# Patient Record
Sex: Female | Born: 1995 | Race: Black or African American | Hispanic: No | Marital: Single | Smoking: Never smoker
Health system: Southern US, Community
[De-identification: ages and names within clinical notes are randomized; demographics above are authoritative.]

---
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
## 2015-11-30 DIAGNOSIS — Z3200 Encounter for pregnancy test, result unknown: Secondary | ICD-10-CM | POA: Diagnosis not present

## 2015-11-30 DIAGNOSIS — N921 Excessive and frequent menstruation with irregular cycle: Secondary | ICD-10-CM | POA: Diagnosis not present

## 2015-11-30 DIAGNOSIS — Z7251 High risk heterosexual behavior: Secondary | ICD-10-CM | POA: Diagnosis not present

## 2015-11-30 DIAGNOSIS — Z113 Encounter for screening for infections with a predominantly sexual mode of transmission: Secondary | ICD-10-CM | POA: Diagnosis not present

## 2015-12-06 DIAGNOSIS — A5400 Gonococcal infection of lower genitourinary tract, unspecified: Secondary | ICD-10-CM | POA: Diagnosis not present

## 2015-12-12 DIAGNOSIS — R938 Abnormal findings on diagnostic imaging of other specified body structures: Secondary | ICD-10-CM | POA: Diagnosis not present

## 2015-12-12 DIAGNOSIS — N921 Excessive and frequent menstruation with irregular cycle: Secondary | ICD-10-CM | POA: Diagnosis not present

## 2015-12-12 DIAGNOSIS — R300 Dysuria: Secondary | ICD-10-CM | POA: Diagnosis not present

## 2015-12-22 DIAGNOSIS — Z1159 Encounter for screening for other viral diseases: Secondary | ICD-10-CM | POA: Diagnosis not present

## 2015-12-22 DIAGNOSIS — R350 Frequency of micturition: Secondary | ICD-10-CM | POA: Diagnosis not present

## 2015-12-22 DIAGNOSIS — R102 Pelvic and perineal pain: Secondary | ICD-10-CM | POA: Diagnosis not present

## 2015-12-22 DIAGNOSIS — Z114 Encounter for screening for human immunodeficiency virus [HIV]: Secondary | ICD-10-CM | POA: Diagnosis not present

## 2015-12-22 DIAGNOSIS — Z113 Encounter for screening for infections with a predominantly sexual mode of transmission: Secondary | ICD-10-CM | POA: Diagnosis not present

## 2015-12-22 DIAGNOSIS — R109 Unspecified abdominal pain: Secondary | ICD-10-CM | POA: Diagnosis not present

## 2016-01-23 DIAGNOSIS — Z681 Body mass index (BMI) 19 or less, adult: Secondary | ICD-10-CM | POA: Diagnosis not present

## 2016-01-23 DIAGNOSIS — Z113 Encounter for screening for infections with a predominantly sexual mode of transmission: Secondary | ICD-10-CM | POA: Diagnosis not present

## 2016-01-23 DIAGNOSIS — Z01419 Encounter for gynecological examination (general) (routine) without abnormal findings: Secondary | ICD-10-CM | POA: Diagnosis not present

## 2016-02-07 DIAGNOSIS — H5213 Myopia, bilateral: Secondary | ICD-10-CM | POA: Diagnosis not present

## 2016-03-30 DIAGNOSIS — S30860A Insect bite (nonvenomous) of lower back and pelvis, initial encounter: Secondary | ICD-10-CM | POA: Diagnosis not present

## 2016-05-26 ENCOUNTER — Emergency Department (HOSPITAL_COMMUNITY)
Admission: EM | Admit: 2016-05-26 | Discharge: 2016-05-26 | Disposition: A | Discharge status: Home / Self Care | Payer: BLUE CROSS/BLUE SHIELD | Attending: Emergency Medicine | Admitting: Emergency Medicine

## 2016-05-26 ENCOUNTER — Emergency Department (HOSPITAL_COMMUNITY): Payer: BLUE CROSS/BLUE SHIELD

## 2016-05-26 ENCOUNTER — Encounter (HOSPITAL_COMMUNITY): Payer: Self-pay

## 2016-05-26 DIAGNOSIS — T7411XA Adult physical abuse, confirmed, initial encounter: Secondary | ICD-10-CM | POA: Diagnosis not present

## 2016-05-26 DIAGNOSIS — Y929 Unspecified place or not applicable: Secondary | ICD-10-CM | POA: Insufficient documentation

## 2016-05-26 DIAGNOSIS — S0993XA Unspecified injury of face, initial encounter: Secondary | ICD-10-CM | POA: Diagnosis present

## 2016-05-26 DIAGNOSIS — S01112A Laceration without foreign body of left eyelid and periocular area, initial encounter: Secondary | ICD-10-CM | POA: Diagnosis not present

## 2016-05-26 DIAGNOSIS — S0181XA Laceration without foreign body of other part of head, initial encounter: Secondary | ICD-10-CM

## 2016-05-26 DIAGNOSIS — S0512XA Contusion of eyeball and orbital tissues, left eye, initial encounter: Secondary | ICD-10-CM | POA: Diagnosis not present

## 2016-05-26 DIAGNOSIS — Y999 Unspecified external cause status: Secondary | ICD-10-CM | POA: Diagnosis not present

## 2016-05-26 DIAGNOSIS — Y939 Activity, unspecified: Secondary | ICD-10-CM | POA: Insufficient documentation

## 2016-05-26 DIAGNOSIS — Z23 Encounter for immunization: Secondary | ICD-10-CM | POA: Insufficient documentation

## 2016-05-26 MED ORDER — LIDOCAINE HCL 2 % IJ SOLN
20.0000 mL | Freq: Once | INTRAMUSCULAR | Status: AC
  Administered 2016-05-26: 400 mg
  Filled 2016-05-26: qty 20

## 2016-05-26 MED ORDER — IBUPROFEN 400 MG PO TABS
600.0000 mg | ORAL_TABLET | Freq: Once | ORAL | Status: AC
  Administered 2016-05-26: 600 mg via ORAL
  Filled 2016-05-26: qty 1

## 2016-05-26 MED ORDER — TETANUS-DIPHTH-ACELL PERTUSSIS 5-2.5-18.5 LF-MCG/0.5 IM SUSP
0.5000 mL | Freq: Once | INTRAMUSCULAR | Status: AC
Start: 2016-05-26 — End: 2016-05-26
  Administered 2016-05-26: 0.5 mL via INTRAMUSCULAR
  Filled 2016-05-26: qty 0.5
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
## 2016-05-26 NOTE — ED Notes (Signed)
Declined W/C at D/C and was escorted to lobby by RN. 

## 2016-05-26 NOTE — Discharge Instructions (Signed)
Return here as needed.  Follow-up with your doctor in 5 days to have the sutures removed

## 2016-05-31 DIAGNOSIS — Z309 Encounter for contraceptive management, unspecified: Secondary | ICD-10-CM | POA: Diagnosis not present

## 2016-05-31 DIAGNOSIS — S0181XA Laceration without foreign body of other part of head, initial encounter: Secondary | ICD-10-CM | POA: Diagnosis not present

## 2016-05-31 DIAGNOSIS — N912 Amenorrhea, unspecified: Secondary | ICD-10-CM | POA: Diagnosis not present

## 2016-05-31 DIAGNOSIS — Z4802 Encounter for removal of sutures: Secondary | ICD-10-CM | POA: Diagnosis not present

## 2016-06-21 DIAGNOSIS — L03032 Cellulitis of left toe: Secondary | ICD-10-CM | POA: Diagnosis not present

## 2016-07-16 ENCOUNTER — Other Ambulatory Visit (HOSPITAL_COMMUNITY)
Admission: RE | Admit: 2016-07-16 | Discharge: 2016-07-16 | Disposition: A | Discharge status: Home / Self Care | Payer: BLUE CROSS/BLUE SHIELD | Source: Ambulatory Visit | Attending: Family | Admitting: Family

## 2016-07-16 ENCOUNTER — Other Ambulatory Visit: Payer: Self-pay

## 2016-07-16 DIAGNOSIS — Z0000 Encounter for general adult medical examination without abnormal findings: Secondary | ICD-10-CM | POA: Diagnosis not present

## 2016-07-16 DIAGNOSIS — Z01411 Encounter for gynecological examination (general) (routine) with abnormal findings: Secondary | ICD-10-CM | POA: Insufficient documentation

## 2016-07-16 DIAGNOSIS — Z113 Encounter for screening for infections with a predominantly sexual mode of transmission: Secondary | ICD-10-CM | POA: Insufficient documentation

## 2016-07-16 DIAGNOSIS — Z01419 Encounter for gynecological examination (general) (routine) without abnormal findings: Secondary | ICD-10-CM | POA: Diagnosis not present

## 2016-07-16 DIAGNOSIS — Z1151 Encounter for screening for human papillomavirus (HPV): Secondary | ICD-10-CM | POA: Diagnosis not present

## 2016-07-23 LAB — CYTOLOGY - PAP
Chlamydia: NEGATIVE
Diagnosis: UNDETERMINED — AB
HPV (WINDOPATH): NOT DETECTED
Neisseria Gonorrhea: NEGATIVE
TRICH (WINDOWPATH): NEGATIVE

## 2016-12-24 DIAGNOSIS — S90851A Superficial foreign body, right foot, initial encounter: Secondary | ICD-10-CM | POA: Diagnosis not present

## 2017-02-22 DIAGNOSIS — B373 Candidiasis of vulva and vagina: Secondary | ICD-10-CM | POA: Diagnosis not present

## 2017-02-22 DIAGNOSIS — N760 Acute vaginitis: Secondary | ICD-10-CM | POA: Diagnosis not present

## 2017-02-22 DIAGNOSIS — B9689 Other specified bacterial agents as the cause of diseases classified elsewhere: Secondary | ICD-10-CM | POA: Diagnosis not present

## 2017-02-22 DIAGNOSIS — Z01419 Encounter for gynecological examination (general) (routine) without abnormal findings: Secondary | ICD-10-CM | POA: Diagnosis not present

## 2017-05-07 DIAGNOSIS — R208 Other disturbances of skin sensation: Secondary | ICD-10-CM | POA: Diagnosis not present

## 2017-05-07 DIAGNOSIS — B081 Molluscum contagiosum: Secondary | ICD-10-CM | POA: Diagnosis not present

## 2017-05-07 DIAGNOSIS — R238 Other skin changes: Secondary | ICD-10-CM | POA: Diagnosis not present

## 2017-05-07 DIAGNOSIS — L700 Acne vulgaris: Secondary | ICD-10-CM | POA: Diagnosis not present

## 2017-05-07 DIAGNOSIS — L538 Other specified erythematous conditions: Secondary | ICD-10-CM | POA: Diagnosis not present

## 2017-05-07 DIAGNOSIS — L728 Other follicular cysts of the skin and subcutaneous tissue: Secondary | ICD-10-CM | POA: Diagnosis not present

## 2017-05-07 DIAGNOSIS — L0889 Other specified local infections of the skin and subcutaneous tissue: Secondary | ICD-10-CM | POA: Diagnosis not present
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
## 2017-06-14 DIAGNOSIS — H5213 Myopia, bilateral: Secondary | ICD-10-CM | POA: Diagnosis not present

## 2017-07-16 DIAGNOSIS — Z113 Encounter for screening for infections with a predominantly sexual mode of transmission: Secondary | ICD-10-CM | POA: Diagnosis not present

## 2018-01-08 DIAGNOSIS — N898 Other specified noninflammatory disorders of vagina: Secondary | ICD-10-CM | POA: Diagnosis not present

## 2018-03-12 DIAGNOSIS — H35413 Lattice degeneration of retina, bilateral: Secondary | ICD-10-CM | POA: Diagnosis not present

## 2019-01-08 DIAGNOSIS — L700 Acne vulgaris: Secondary | ICD-10-CM | POA: Diagnosis not present
